# Patient Record
Sex: Male | Born: 2000 | Race: White | Hispanic: No | Marital: Single
Health system: Southern US, Community
[De-identification: ages and names within clinical notes are randomized; demographics above are authoritative.]

## PROBLEM LIST (undated history)

## (undated) DIAGNOSIS — J020 Streptococcal pharyngitis: Secondary | ICD-10-CM

## (undated) HISTORY — PX: FRACTURE SURGERY: SHX138

## (undated) HISTORY — DX: Streptococcal pharyngitis: J02.0

## (undated) HISTORY — PX: TONSILLECTOMY: SUR1361

## (undated) HISTORY — PX: OTHER SURGICAL HISTORY: SHX169

---
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
## 2019-04-15 ENCOUNTER — Emergency Department (HOSPITAL_COMMUNITY)
Admission: EM | Admit: 2019-04-15 | Discharge: 2019-04-15 | Disposition: A | Discharge status: Home / Self Care | Payer: BLUE CROSS/BLUE SHIELD | Attending: Emergency Medicine | Admitting: Emergency Medicine

## 2019-04-15 ENCOUNTER — Encounter (HOSPITAL_COMMUNITY): Payer: Self-pay

## 2019-04-15 ENCOUNTER — Other Ambulatory Visit: Payer: Self-pay

## 2019-04-15 DIAGNOSIS — Z7722 Contact with and (suspected) exposure to environmental tobacco smoke (acute) (chronic): Secondary | ICD-10-CM | POA: Insufficient documentation

## 2019-04-15 DIAGNOSIS — B349 Viral infection, unspecified: Secondary | ICD-10-CM | POA: Insufficient documentation

## 2019-04-15 DIAGNOSIS — Z20828 Contact with and (suspected) exposure to other viral communicable diseases: Secondary | ICD-10-CM | POA: Insufficient documentation

## 2019-04-15 NOTE — Discharge Instructions (Signed)
Please read and follow all provided instructions.  Your diagnoses today include:  1. Viral syndrome     Tests performed today include:  Coronavirus test - pending  Vital signs. See below for your results today.   Medications prescribed:  Please use over-the-counter NSAID medications (ibuprofen, naproxen) as directed on the packaging for pain.   Take any prescribed medications only as directed.  Home care instructions:  Follow any educational materials contained in this packet.  BE VERY CAREFUL not to take multiple medicines containing Tylenol (also called acetaminophen). Doing so can lead to an overdose which can damage your liver and cause liver failure and possibly death.   Follow-up instructions: Please follow-up with your primary care provider in the next 3 days for further evaluation of your symptoms.   Return instructions:   Please return to the Emergency Department if you experience worsening symptoms.   Return with worsening symptoms including worsening shortness of breath, trouble breathing, persistent vomiting.  Please return if you have any other emergent concerns.  Additional Information:  Your vital signs today were: BP (!) 153/69 (BP Location: Right Arm)   Pulse 82   Temp 98.8 F (37.1 C) (Oral)   Resp 17   Ht 5\' 10"  (1.778 m)   Wt 113.4 kg   SpO2 100%   BMI 35.87 kg/m  If your blood pressure (BP) was elevated above 135/85 this visit, please have this repeated by your doctor within one month. --------------

## 2019-04-15 NOTE — ED Triage Notes (Signed)
Pt reports fever, sore throat and fatigue. Denies exposures. He is requesting a COVID test.

## 2019-04-15 NOTE — ED Provider Notes (Signed)
[facility]-EMERGENCY DEPT Provider Note   CSN: [id number] Arrival date & time: 04/15/19  1959     History Chief Complaint  Patient presents with  . Fever    Christian Cisneros is a 18 y.o. male.  Patient presents to the emergency department with 2 days of low-grade fever, sore throat, and fatigue.  He is here mainly requesting a Covid test.  He denies any ear pain, runny nose.  No chest pain or significant shortness of breath.  No nausea, vomiting, or diarrhea.  He can still taste and smell.  He states that he works around other people, however denies any specific known exposures.  Onset of symptoms acute.  Course is constant.  Nothing makes symptoms better or worse.        Past Medical History:  Diagnosis Date  . Strep pharyngitis     Patient Active Problem List   Diagnosis Date Noted  . Contact dermatitis 08/26/2013  . MSSA (methicillin susceptible Staphylococcus aureus) infection 08/26/2013  . 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 FRACTURE SURGERY    . pyloric stenosis    . TONSILLECTOMY         Family History  Problem Relation Age of Onset  . Gout Father   . Hypertension Maternal Grandmother   . Cancer Paternal Grandmother     Social History   Tobacco Use  . Smoking status: Passive Smoke Exposure - Never Smoker  . Smokeless tobacco: Never Used  Substance Use Topics  . Alcohol use: No  . Drug use: No    Home Medications Prior to Admission medications   Medication Sig Start Date End Date Taking? Authorizing Provider  cephALEXin (KEFLEX) 500 MG capsule Take 1 capsule (500 mg total) by mouth 4 (four) times daily. 09/01/13   Randall Hiss, MD  diphenhydrAMINE (BENADRYL) 25 mg capsule Take 25 mg by mouth every 6 (six) hours as needed.    [provider]    Allergies    Patient has no known allergies.  Review of Systems     Review of Systems  Constitutional: Positive for fatigue and fever. Negative for chills.  HENT: Positive for sore throat. Negative for congestion, ear pain, rhinorrhea and sinus pressure.   Eyes: Negative for redness.  Respiratory: Negative for cough and wheezing.   Gastrointestinal: Negative for abdominal pain, diarrhea, nausea and vomiting.  Genitourinary: Negative for dysuria.  Musculoskeletal: Negative for myalgias and neck stiffness.  Skin: Negative for rash.  Neurological: Negative for headaches.  Hematological: Negative for adenopathy.    Physical Exam Updated Vital Signs BP (!) 153/69 (BP Location: Right Arm)   Pulse 82   Temp 98.8 F (37.1 C) (Oral)   Resp 17   Ht 5\' 10"  (1.778 m)   Wt 113.4 kg   SpO2 100%   BMI 35.87 kg/m   Physical Exam Vitals and nursing note reviewed.  Constitutional:      Appearance: He is well-developed.  HENT:     Head: Normocephalic and atraumatic.     Jaw: No trismus.     Right Ear: Tympanic membrane, ear canal and external ear normal.     Left Ear: Tympanic membrane, ear canal and external ear normal.     Nose: Nose normal. No mucosal edema or rhinorrhea.     Mouth/Throat:     Mouth: Mucous membranes are not dry.  Pharynx: Uvula midline. No oropharyngeal exudate, posterior oropharyngeal erythema or uvula swelling.     Tonsils: No tonsillar abscesses.  Eyes:     General:        Right eye: No discharge.        Left eye: No discharge.     Conjunctiva/sclera: Conjunctivae normal.  Cardiovascular:     Rate and Rhythm: Normal rate and regular rhythm.     Heart sounds: Normal heart sounds.  Pulmonary:     Effort: Pulmonary effort is normal. No respiratory distress.     Breath sounds: Normal breath sounds. No wheezing or rales.  Abdominal:     Palpations: Abdomen is soft.     Tenderness: There is no abdominal tenderness.  Musculoskeletal:     Cervical back: Normal range of motion and neck supple.  Skin:    General: Skin is warm  and dry.  Neurological:     Mental Status: He is alert.     ED Results / Procedures / Treatments   Labs (all labs ordered are listed, but only abnormal results are displayed) Labs Reviewed  NOVEL CORONAVIRUS, NAA (HOSP ORDER, SEND-OUT TO REF LAB; TAT 18-24 HRS)    EKG None  Radiology No results found.  Procedures Procedures (including critical care time)  Medications Ordered in ED Medications - No data to display  ED Course  I have reviewed the triage vital signs and the nursing notes.  Pertinent labs & imaging results that were available during my care of the patient were reviewed by me and considered in my medical decision making (see chart for details).  Patient seen and examined.  Patient looks well.  Will perform coronavirus testing.  Counseled isolation at home until results return.  Discussed use of OTC meds for symptom control.  Patient verbalizes understanding agrees with plan.  Encouraged return with worsening shortness of breath or trouble breathing, vomiting, new concerning symptoms.  Vital signs reviewed and are as follows: BP (!) 153/69 (BP Location: Right Arm)   Pulse 82   Temp 98.8 F (37.1 C) (Oral)   Resp 17   Ht 5\' 10"  (1.778 m)   Wt 113.4 kg   SpO2 100%   BMI 35.87 kg/m        MDM Rules/Calculators/A&P                      Patient with mild viral type symptoms.  Covid testing sent.  Symptomatic treatment indicated at this time.  Patient looks well, nontoxic.  Final Clinical Impression(s) / ED Diagnoses Final diagnoses:  Viral syndrome    Rx / DC Orders ED Discharge Orders    None       Suann Larry 04/15/19 2124    Hayden Rasmussen, MD 04/16/19 1224

## 2019-04-17 LAB — NOVEL CORONAVIRUS, NAA (HOSP ORDER, SEND-OUT TO REF LAB; TAT 18-24 HRS): SARS-CoV-2, NAA: NOT DETECTED
